# Patient Record
Sex: Female | Born: 1955 | Race: White | Hispanic: No | Marital: Married | Smoking: Former smoker
Health system: Southern US, Community
[De-identification: ages and names within clinical notes are randomized; demographics above are authoritative.]

## PROBLEM LIST (undated history)

## (undated) DIAGNOSIS — IMO0002 Reserved for concepts with insufficient information to code with codable children: Secondary | ICD-10-CM

## (undated) DIAGNOSIS — H269 Unspecified cataract: Secondary | ICD-10-CM

## (undated) DIAGNOSIS — G4733 Obstructive sleep apnea (adult) (pediatric): Secondary | ICD-10-CM

## (undated) DIAGNOSIS — M1990 Unspecified osteoarthritis, unspecified site: Secondary | ICD-10-CM

## (undated) DIAGNOSIS — H409 Unspecified glaucoma: Secondary | ICD-10-CM

## (undated) DIAGNOSIS — T7840XA Allergy, unspecified, initial encounter: Secondary | ICD-10-CM

## (undated) HISTORY — DX: Unspecified glaucoma: H40.9

## (undated) HISTORY — DX: Reserved for concepts with insufficient information to code with codable children: IMO0002

## (undated) HISTORY — DX: Obstructive sleep apnea (adult) (pediatric): G47.33

## (undated) HISTORY — DX: Unspecified osteoarthritis, unspecified site: M19.90

## (undated) HISTORY — PX: EYE SURGERY: SHX253

## (undated) HISTORY — DX: Allergy, unspecified, initial encounter: T78.40XA

## (undated) HISTORY — DX: Unspecified cataract: H26.9

## (undated) HISTORY — PX: TUBAL LIGATION: SHX77

---
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
## 1993-06-24 HISTORY — PX: TOTAL ABDOMINAL HYSTERECTOMY: SHX209

## 2000-04-10 ENCOUNTER — Emergency Department (HOSPITAL_COMMUNITY): Admission: EM | Admit: 2000-04-10 | Discharge: 2000-04-10 | Payer: Self-pay | Admitting: Emergency Medicine

## 2002-04-30 ENCOUNTER — Encounter: Payer: Self-pay | Admitting: Pulmonary Disease

## 2002-04-30 ENCOUNTER — Ambulatory Visit (HOSPITAL_BASED_OUTPATIENT_CLINIC_OR_DEPARTMENT_OTHER): Admission: RE | Admit: 2002-04-30 | Discharge: 2002-04-30 | Payer: Self-pay | Admitting: *Deleted

## 2002-09-15 ENCOUNTER — Emergency Department (HOSPITAL_COMMUNITY): Admission: EM | Admit: 2002-09-15 | Discharge: 2002-09-15 | Payer: Self-pay | Admitting: Emergency Medicine

## 2002-09-15 ENCOUNTER — Encounter: Payer: Self-pay | Admitting: Emergency Medicine

## 2004-06-15 ENCOUNTER — Emergency Department (HOSPITAL_COMMUNITY): Admission: EM | Admit: 2004-06-15 | Discharge: 2004-06-15 | Payer: Self-pay | Admitting: Emergency Medicine

## 2004-09-20 ENCOUNTER — Ambulatory Visit: Payer: Self-pay | Admitting: Pulmonary Disease

## 2005-02-28 ENCOUNTER — Ambulatory Visit: Payer: Self-pay | Admitting: Physical Medicine & Rehabilitation

## 2005-02-28 ENCOUNTER — Encounter
Admission: RE | Admit: 2005-02-28 | Discharge: 2005-05-29 | Payer: Self-pay | Admitting: Physical Medicine & Rehabilitation
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
## 2005-10-02 ENCOUNTER — Ambulatory Visit: Payer: Self-pay | Admitting: Pulmonary Disease

## 2006-11-26 ENCOUNTER — Ambulatory Visit: Payer: Self-pay | Admitting: Pulmonary Disease

## 2008-09-01 DIAGNOSIS — G4733 Obstructive sleep apnea (adult) (pediatric): Secondary | ICD-10-CM

## 2008-09-02 ENCOUNTER — Ambulatory Visit: Payer: Self-pay | Admitting: Pulmonary Disease

## 2010-11-09 NOTE — Procedures (Signed)
Courtney Mann, AHN NO.:  [id number]   MEDICAL RECORD NO.:  [id number]          PATIENT TYPE:  REC   LOCATION:  [facility]                          FACILITY:  [facility]   PHYSICIAN:  Erick Colace, M.D.DATE OF BIRTH:  Oct 05, 1955   DATE OF PROCEDURE:  03/14/2005  DATE OF DISCHARGE:                                 OPERATIVE REPORT   MEDICAL RECORD NUMBER:  [id number].   A 55 year old female with neck pain as well as upper trapezius and right arm  pain. She states she barely tolerated the acupuncture needles placed on  March 04, 2005 but would like to try another treatment today.   Placed 1/2-inch needle at points 3-cm lateral spinous process of C3, C5, C7.  The patient decided that she could not tolerate the idea of needles in her  neck and decided to terminate the treatment. She had no adverse events but  simply stated she does not like needles. I will see her back on an as-needed  basis, and she will follow up with Dr. Danne Harbor.      Erick Colace, M.D.  Electronically Signed     AEK/MEDQ  D:  03/14/2005 13:03:22  T:  03/15/2005 10:04:41  Job:  [id number]   cc:   Caralyn Guile. Ethelene Hal, M.D.  Fax: [phone]
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
## 2011-05-06 ENCOUNTER — Ambulatory Visit: Payer: Self-pay | Admitting: Pulmonary Disease

## 2011-05-27 ENCOUNTER — Ambulatory Visit: Payer: Self-pay | Admitting: Pulmonary Disease

## 2011-06-10 ENCOUNTER — Encounter: Payer: Self-pay | Admitting: Pulmonary Disease

## 2011-06-11 ENCOUNTER — Encounter: Payer: Self-pay | Admitting: Pulmonary Disease

## 2011-06-11 ENCOUNTER — Ambulatory Visit (INDEPENDENT_AMBULATORY_CARE_PROVIDER_SITE_OTHER): Payer: 59 | Admitting: Pulmonary Disease

## 2011-06-11 VITALS — BP 138/88 | HR 71 | Temp 97.9°F | Ht 67.0 in | Wt 235.0 lb

## 2011-06-11 DIAGNOSIS — G4733 Obstructive sleep apnea (adult) (pediatric): Secondary | ICD-10-CM

## 2011-06-11 NOTE — Assessment & Plan Note (Signed)
The patient is doing well on CPAP currently, and feels that she is sleeping adequately with no significant sleepiness issues during the day.  She is overdue for a new mask, and denies any functioning issues with her CPAP device.  She is having some very mild air gulping that occurs intermittently, but it is not an issue for her.  I have told her that we can change her mask or pressure delivery if this becomes more of an issue.  I have also encouraged her to work aggressively on weight loss.

## 2011-06-11 NOTE — Patient Instructions (Addendum)
Continue with cpap, but call if your air gulping is getting worse. Work on weight loss followup with me in one year.

## 2011-06-11 NOTE — Progress Notes (Signed)
  Subjective:    Patient ID: Courtney Mann, female    DOB: 05-Apr-1956, 55 y.o.   MRN: [id number]  HPI The patient comes in today for followup of her known sleep apnea.  She is wearing CPAP compliantly, and is having no issues with her mask or device.  Her mask is due for a change.  Her only complaint today is mild air gulping that occurs on rare occasions.  She does not feel that it is a significant issue for her.  She feels that she sleeps well with the CPAP device, and is satisfied with her alertness during the day.   Review of Systems  Constitutional: Negative for fever and unexpected weight change.  HENT: Positive for congestion, dental problem and sinus pressure. Negative for ear pain, nosebleeds, sore throat, rhinorrhea, sneezing, trouble swallowing and postnasal drip.   Eyes: Positive for redness and itching.  Respiratory: Negative for cough, chest tightness, shortness of breath and wheezing.   Cardiovascular: Positive for palpitations and leg swelling.  Gastrointestinal: Negative for nausea and vomiting.  Genitourinary: Negative for dysuria.  Musculoskeletal: Positive for joint swelling.  Skin: Negative for rash.  Neurological: Positive for headaches.  Hematological: Does not bruise/bleed easily.  Psychiatric/Behavioral: Negative for dysphoric mood. The patient is not nervous/anxious.        Objective:   Physical Exam Overweight female in no acute distress No skin breakdown or pressure necrosis from the CPAP mask Nose without purulence or discharge Lower extremities with mild edema, no cyanosis Alert, does not appear to be sleepy, moves all 4 extremities.       Assessment & Plan:

## 2011-07-26 ENCOUNTER — Telehealth: Payer: Self-pay | Admitting: Pulmonary Disease

## 2011-07-26 DIAGNOSIS — G4733 Obstructive sleep apnea (adult) (pediatric): Secondary | ICD-10-CM

## 2011-07-26 NOTE — Telephone Encounter (Signed)
Per megan okay to send order for new cpap mask and supplies since pt was just seen 06/11/11 and told to f/u in 1 year. Order has been sent to [facility].

## 2012-06-10 ENCOUNTER — Ambulatory Visit: Payer: 59 | Admitting: Pulmonary Disease

## 2012-06-15 ENCOUNTER — Ambulatory Visit (INDEPENDENT_AMBULATORY_CARE_PROVIDER_SITE_OTHER): Payer: 59 | Admitting: Pulmonary Disease

## 2012-06-15 ENCOUNTER — Encounter: Payer: Self-pay | Admitting: Pulmonary Disease

## 2012-06-15 VITALS — BP 122/76 | HR 82 | Temp 97.6°F | Ht 67.0 in | Wt 221.2 lb

## 2012-06-15 DIAGNOSIS — G4733 Obstructive sleep apnea (adult) (pediatric): Secondary | ICD-10-CM

## 2012-06-15 NOTE — Patient Instructions (Addendum)
Continue with cpap, and keep up with mask changes. Keep working on weight loss, you are doing great. Call us if you lose further weight, and feel the pressure is too high.  We can re-optimize your pressure at home at that point.  followup with me in one year.

## 2012-06-15 NOTE — Assessment & Plan Note (Signed)
The patient has been wearing CPAP compliantly, and has done well with weight loss.  She had actually lost more weight prior to this visit, and felt the pressure was too high.  Currently, she feels the pressure is adequate, and I have asked her to call us if she continues to lose weight so that we can optimize her pressure again.  I've also asked her to keep up with mask changes and supplies.

## 2012-06-15 NOTE — Progress Notes (Signed)
  Subjective:    Patient ID: Courtney Mann, female    DOB: 05-Mar-1956, 56 y.o.   MRN: [id number]  HPI The patient comes in today for followup of her known obstructive sleep apnea.  She has lost over 30 pounds since last visit, and was feeling the pressure was too high.  She has since gained half of that back, and now feels that her pressure is adequate.  She is sleeping well with the device, and has adequate daytime alertness.   Review of Systems  Constitutional: Negative for fever and unexpected weight change.  HENT: Positive for congestion, rhinorrhea, postnasal drip and sinus pressure. Negative for ear pain, nosebleeds, sore throat, sneezing, trouble swallowing and dental problem.   Eyes: Negative for redness and itching.  Respiratory: Negative for cough, chest tightness, shortness of breath and wheezing.   Cardiovascular: Positive for palpitations ( dysrhythmia) and leg swelling ( lower legs and ankles).  Gastrointestinal: Negative for nausea and vomiting.  Genitourinary: Negative for dysuria.  Musculoskeletal: Positive for arthralgias ( arthritis in both hips and spine, bursitis in left hip). Negative for joint swelling.  Skin: Negative for rash.  Neurological: Positive for headaches.  Hematological: Does not bruise/bleed easily.  Psychiatric/Behavioral: Negative for dysphoric mood. The patient is not nervous/anxious.        Objective:   Physical Exam Obese female in no acute distress No skin breakdown or pressure necrosis from the CPAP mask Nose without purulence or discharge noted Neck without lymphadenopathy or thyromegaly Lower extremities with mild edema, no cyanosis Alert and oriented, moves all 4 extremities.        Assessment & Plan:

## 2013-04-18 ENCOUNTER — Ambulatory Visit: Payer: 59 | Admitting: Family Medicine

## 2013-04-18 VITALS — BP 110/70 | HR 92 | Temp 97.8°F | Resp 18 | Ht 67.5 in | Wt 216.0 lb

## 2013-04-18 DIAGNOSIS — R300 Dysuria: Secondary | ICD-10-CM

## 2013-04-18 DIAGNOSIS — N390 Urinary tract infection, site not specified: Secondary | ICD-10-CM

## 2013-04-18 DIAGNOSIS — N898 Other specified noninflammatory disorders of vagina: Secondary | ICD-10-CM

## 2013-04-18 LAB — POCT WET PREP WITH KOH
KOH Prep POC: NEGATIVE
RBC Wet Prep HPF POC: NEGATIVE
Trichomonas, UA: NEGATIVE
Yeast Wet Prep HPF POC: NEGATIVE

## 2013-04-18 LAB — POCT UA - MICROSCOPIC ONLY
Casts, Ur, LPF, POC: NEGATIVE
Crystals, Ur, HPF, POC: NEGATIVE
Mucus, UA: NEGATIVE
Yeast, UA: NEGATIVE

## 2013-04-18 LAB — POCT URINALYSIS DIPSTICK
Bilirubin, UA: NEGATIVE
Glucose, UA: NEGATIVE
Ketones, UA: NEGATIVE
Nitrite, UA: NEGATIVE
Protein, UA: NEGATIVE
Spec Grav, UA: 1.01
Urobilinogen, UA: 0.2
pH, UA: 6

## 2013-04-18 MED ORDER — NITROFURANTOIN MONOHYD MACRO 100 MG PO CAPS: 100.0000 mg | ORAL_CAPSULE | Freq: Two times a day (BID) | ORAL | Status: DC

## 2013-04-18 MED ORDER — CEPHALEXIN 500 MG PO CAPS: 500.0000 mg | ORAL_CAPSULE | Freq: Four times a day (QID) | ORAL | Status: DC

## 2013-04-18 NOTE — Patient Instructions (Signed)
Urinary Tract Infection  Urinary tract infections (UTIs) can develop anywhere along your urinary tract. Your urinary tract is your body's drainage system for removing wastes and extra water. Your urinary tract includes two kidneys, two ureters, a bladder, and a urethra. Your kidneys are a pair of bean-shaped organs. Each kidney is about the size of your fist. They are located below your ribs, one on each side of your spine.  CAUSES  Infections are caused by microbes, which are microscopic organisms, including fungi, viruses, and bacteria. These organisms are so small that they can only be seen through a microscope. Bacteria are the microbes that most commonly cause UTIs.  SYMPTOMS   Symptoms of UTIs may vary by age and gender of the patient and by the location of the infection. Symptoms in young women typically include a frequent and intense urge to urinate and a painful, burning feeling in the bladder or urethra during urination. Older women and men are more likely to be tired, shaky, and weak and have muscle aches and abdominal pain. A fever may mean the infection is in your kidneys. Other symptoms of a kidney infection include pain in your back or sides below the ribs, nausea, and vomiting.  DIAGNOSIS  To diagnose a UTI, your caregiver will ask you about your symptoms. Your caregiver also will ask to provide a urine sample. The urine sample will be tested for bacteria and white blood cells. White blood cells are made by your body to help fight infection.  TREATMENT   Typically, UTIs can be treated with medication. Because most UTIs are caused by a bacterial infection, they usually can be treated with the use of antibiotics. The choice of antibiotic and length of treatment depend on your symptoms and the type of bacteria causing your infection.  HOME CARE INSTRUCTIONS   If you were prescribed antibiotics, take them exactly as your caregiver instructs you. Finish the medication even if you feel better after you  have only taken some of the medication.   Drink enough water and fluids to keep your urine clear or pale yellow.   Avoid caffeine, tea, and carbonated beverages. They tend to irritate your bladder.   Empty your bladder often. Avoid holding urine for long periods of time.   Empty your bladder before and after sexual intercourse.   After a bowel movement, women should cleanse from front to back. Use each tissue only once.  SEEK MEDICAL CARE IF:    You have back pain.   You develop a fever.   Your symptoms do not begin to resolve within 3 days.  SEEK IMMEDIATE MEDICAL CARE IF:    You have severe back pain or lower abdominal pain.   You develop chills.   You have nausea or vomiting.   You have continued burning or discomfort with urination.  MAKE SURE YOU:    Understand these instructions.   Will watch your condition.   Will get help right away if you are not doing well or get worse.  Document Released: 03/20/2005 Document Revised: 12/10/2011 Document Reviewed: 07/19/2011  [employer] Patient Information 2014 [employer]

## 2013-04-18 NOTE — Progress Notes (Signed)
[facility]:  Office Visit  Chief Complaint:  Chief Complaint  Patient presents with   Dysuria   Vaginal Discharge    HPI: Courtney Mann is a 57 y.o. female who is here for  Vaginal discharge. The discharge started about two months ago and no foul smell. She does a vaginal douche every other day. Had a yeast infection two weeks ago which turned into a UTI. She had some burning pain with urination , denies fevers, chill, n/v/pelvic pain or acute back pain  Past Medical History  Diagnosis Date   OSA (obstructive sleep apnea)    Allergy    Arthritis    Ulcer    Cataract    Glaucoma    Past Surgical History  Procedure Laterality Date   Total abdominal hysterectomy  1995   Tonsillectomy and adenoidectomy  1960   Tubal ligation     Eye surgery     History   Social History   Marital Status: Married    Spouse Name: N/A    Number of Children: N/A   Years of Education: N/A   Social History Main Topics   Smoking status: Former Smoker -- 4.00 packs/day for 14 years    Types: Cigarettes    Quit date: 06/24/1980   Smokeless tobacco: None     Comment: 4 PPD x 4-5 years   Alcohol Use: None   Drug Use: None   Sexual Activity: None   Other Topics Concern   None   Social History Narrative   None   Family History  Problem Relation Age of Onset   Hypertension Mother    Lung cancer Father    Dementia Maternal Grandmother    Vision loss Maternal Grandfather    Vision loss Paternal Grandfather    Cirrhosis Brother    Allergies  Allergen Reactions   Azithromycin    Ciprofloxacin    Clarithromycin     Biaxin   Ibuprofen     ? hives   Sulfamethoxazole-Trimethoprim    Prior to Admission medications   Medication Sig Start Date End Date Taking? Authorizing Provider  acetaminophen (ACETAMIN) 500 MG tablet Take 1,000 mg by mouth as needed.     Yes Historical Provider, MD  aspirin 325 MG tablet Take 325 mg by mouth as needed.      Yes Historical Provider, MD  lidocaine (LIDODERM) 5 % Place 1 patch onto the skin daily. Remove & Discard patch within 12 hours or as directed by MD    Yes Historical Provider, MD     ROS: The patient denies fevers, chills, night sweats, unintentional weight loss, chest pain, palpitations, wheezing, dyspnea on exertion, nausea, vomiting, , , hematuria, melena, numbness, weakness, or tingling.   All other systems have been reviewed and were otherwise negative with the exception of those mentioned in the HPI and as above.    PHYSICAL EXAM: Filed Vitals:   04/18/13 0859  BP: 110/70  Pulse: 92  Temp: 97.8 F (36.6 C)  Resp: 18   Filed Vitals:   04/18/13 0859  Height: 5' 7.5" (1.715 m)  Weight: 216 lb (97.977 kg)   Body mass index is 33.31 kg/(m^2).  General: Alert, no acute distress HEENT:  Normocephalic, atraumatic, oropharynx patent. EOMI, PERRLA Cardiovascular:  Regular rate and rhythm, no rubs murmurs or gallops.  No Carotid bruits, radial pulse intact. No pedal edema.  Respiratory: Clear to auscultation bilaterally.  No wheezes, rales, or rhonchi.  No cyanosis, no use of  accessory musculature GI: No organomegaly, abdomen is soft and non-tender, positive bowel sounds.  No masses. Skin: No rashes. Neurologic: Facial musculature symmetric. Psychiatric: Patient is appropriate throughout our interaction. Lymphatic: No cervical lymphadenopathy Musculoskeletal: Gait intact. No CVA tenderness + prolapse bladder + clear vaginal dc, no odor. No masses/lesions No cervix    LABS: Results for orders placed in visit on 04/18/13  POCT UA - MICROSCOPIC ONLY      Result Value Range   WBC, Ur, HPF, POC tntc     RBC, urine, microscopic 2-6     Bacteria, U Microscopic 2+     Mucus, UA neg     Epithelial cells, urine per micros 0-1     Crystals, Ur, HPF, POC neg     Casts, Ur, LPF, POC neg     Yeast, UA neg    POCT URINALYSIS DIPSTICK      Result Value Range   Color, UA yellow      Clarity, UA cloudy     Glucose, UA neg     Bilirubin, UA neg     Ketones, UA neg     Spec Grav, UA 1.010     Blood, UA small     pH, UA 6.0     Protein, UA neg     Urobilinogen, UA 0.2     Nitrite, UA neg     Leukocytes, UA large (3+)    POCT WET PREP WITH KOH      Result Value Range   Trichomonas, UA Negative     Clue Cells Wet Prep HPF POC 1-2     Epithelial Wet Prep HPF POC 0-3     Yeast Wet Prep HPF POC neg     Bacteria Wet Prep HPF POC 1+     RBC Wet Prep HPF POC neg     WBC Wet Prep HPF POC 5-6     KOH Prep POC Negative       EKG/XRAY:   Primary read interpreted by Dr. Conley Rolls at [facility].   ASSESSMENT/PLAN: Encounter Diagnoses  Name Primary?   Dysuria Yes   Vaginal discharge    UTI (urinary tract infection)    High deductible Will give rx for keflex vs macrobid, whichever is cheaper for her since she is self pay with rx drugs F/u prn Declined urine cx since deductible is high, if she still has sxs after abx then she will return for OV and recheck  Gross sideeffects, risk and benefits, and alternatives of medications d/w patient. Patient is aware that all medications have potential sideeffects and we are unable to predict every sideeffect or drug-drug interaction that may occur.  LE, THAO PHUONG, DO 04/18/2013 11:11 AM

## 2013-05-31 ENCOUNTER — Encounter (INDEPENDENT_AMBULATORY_CARE_PROVIDER_SITE_OTHER): Payer: Self-pay

## 2013-05-31 ENCOUNTER — Encounter: Payer: Self-pay | Admitting: Pulmonary Disease

## 2013-05-31 ENCOUNTER — Ambulatory Visit (INDEPENDENT_AMBULATORY_CARE_PROVIDER_SITE_OTHER): Payer: 59 | Admitting: Pulmonary Disease

## 2013-05-31 VITALS — BP 122/82 | HR 63 | Temp 97.5°F | Ht 66.5 in | Wt 210.6 lb

## 2013-05-31 DIAGNOSIS — G4733 Obstructive sleep apnea (adult) (pediatric): Secondary | ICD-10-CM

## 2013-05-31 NOTE — Assessment & Plan Note (Signed)
The patient is doing well with CPAP, and has continued to lose weight slowly over time. I have commended her on this, and asked her to continue doing so. She feels that her pressure may be a little excessive at this time, and we'll therefore recheck her setting on the automatic mode for the next few weeks. I will then let her know the results, but she can remain on the automatic setting if she wishes to do so.

## 2013-05-31 NOTE — Progress Notes (Signed)
   Subjective:    Patient ID: Courtney Mann, female    DOB: 14-May-1956, 57 y.o.   MRN: [id number]  HPI The patient comes in today for followup of her obstructive sleep apnea. She is wearing CPAP compliantly, and overall has done well with the device. She has lost 11 pounds since last visit, and feels that her pressure may be a little bit elevated at this time. She is having no issues with her mask fit. She sleeps well, and has excellent daytime alertness.   Review of Systems  Constitutional: Negative for fever and unexpected weight change.  HENT: Positive for congestion and sinus pressure. Negative for dental problem, ear pain, nosebleeds, postnasal drip, rhinorrhea, sneezing, sore throat and trouble swallowing.        Deviated septum--chronic (minor)  Eyes: Negative for redness and itching.  Respiratory: Negative for cough, chest tightness, shortness of breath and wheezing.   Cardiovascular: Positive for palpitations and leg swelling.  Gastrointestinal: Negative for nausea and vomiting.  Genitourinary: Negative for dysuria.  Musculoskeletal: Negative for joint swelling.  Skin: Negative for rash.  Neurological: Positive for headaches.  Hematological: Does not bruise/bleed easily.  Psychiatric/Behavioral: Negative for dysphoric mood. The patient is not nervous/anxious.        Objective:   Physical Exam Overweight female in no acute distress Nose without purulence or discharge noted No skin breakdown or pressure necrosis from the CPAP mask Neck without lymphadenopathy or thyromegaly Lower extremities with minimal edema, no cyanosis Alert and oriented, moves all 4 extremities.       Assessment & Plan:

## 2013-05-31 NOTE — Patient Instructions (Signed)
Will put your machine on the auto setting for the next few weeks to re-optimize your pressure.  Will let you know the results, but you have the option of staying on the auto setting. Keep working on weight reduction.  You are doing well. followup with me in one year.

## 2013-07-25 ENCOUNTER — Other Ambulatory Visit: Payer: Self-pay | Admitting: Pulmonary Disease

## 2013-07-25 ENCOUNTER — Telehealth: Payer: Self-pay | Admitting: Pulmonary Disease

## 2013-07-25 DIAGNOSIS — G4733 Obstructive sleep apnea (adult) (pediatric): Secondary | ICD-10-CM

## 2013-07-26 ENCOUNTER — Telehealth: Payer: Self-pay | Admitting: Pulmonary Disease

## 2013-07-26 NOTE — Telephone Encounter (Signed)
[facility] Let pt and dme know that her optimal pressure on download appears to be 11-12cm. She can stay on auto if she wishes, or put her machine back on 11cm. Dawne LMTCB for the pt about this.   I spoke with the pt and advised. She wants to go with set pressure of 11. I will let Dawne know so [facility]. Carron CurieJennifer Caren Garske, CMA

## 2013-10-12 ENCOUNTER — Telehealth: Payer: Self-pay | Admitting: Pulmonary Disease

## 2013-10-12 DIAGNOSIS — G4733 Obstructive sleep apnea (adult) (pediatric): Secondary | ICD-10-CM

## 2013-10-12 NOTE — Telephone Encounter (Signed)
I spoke with the pt and she states that her humidifier tank is broken and she called [facility] to get a replacement tank and they advised that she has to have an order. Order placed. Carron CurieJennifer Castillo, CMA

## 2014-05-31 ENCOUNTER — Ambulatory Visit: Payer: 59 | Admitting: Pulmonary Disease

## 2014-08-05 ENCOUNTER — Ambulatory Visit: Payer: Self-pay | Admitting: Pulmonary Disease

## 2014-08-15 ENCOUNTER — Ambulatory Visit: Payer: Self-pay | Admitting: Pulmonary Disease

## 2014-09-15 ENCOUNTER — Ambulatory Visit: Payer: Self-pay | Admitting: Pulmonary Disease

## 2014-10-04 ENCOUNTER — Ambulatory Visit (INDEPENDENT_AMBULATORY_CARE_PROVIDER_SITE_OTHER): Payer: Managed Care, Other (non HMO) | Admitting: Pulmonary Disease

## 2014-10-04 ENCOUNTER — Encounter: Payer: Self-pay | Admitting: Pulmonary Disease

## 2014-10-04 ENCOUNTER — Encounter (INDEPENDENT_AMBULATORY_CARE_PROVIDER_SITE_OTHER): Payer: Self-pay

## 2014-10-04 VITALS — BP 114/62 | HR 68 | Temp 97.0°F | Ht 66.5 in | Wt 216.8 lb

## 2014-10-04 DIAGNOSIS — G4733 Obstructive sleep apnea (adult) (pediatric): Secondary | ICD-10-CM

## 2014-10-04 NOTE — Progress Notes (Signed)
   Subjective:    Patient ID: Courtney Mann, female    DOB: [birth year], 59 y.o.   MRN: [id number]  HPI The patient comes in today for follow-up of her known obstructive sleep apnea. He is wearing C Pap compliantly by her history, and feels that she continues to benefit from the device. She is having some noise coming from her machine, but unfortunately cannot replace it financially at this time. Does not feel that it interferes with the functioning currently.   Review of Systems  Constitutional: Negative for fever and unexpected weight change.  HENT: Negative for congestion, dental problem, ear pain, nosebleeds, postnasal drip, rhinorrhea, sinus pressure, sneezing, sore throat and trouble swallowing.   Eyes: Negative for redness and itching.  Respiratory: Negative for cough, chest tightness, shortness of breath and wheezing.   Cardiovascular: Negative for palpitations and leg swelling.  Gastrointestinal: Negative for nausea and vomiting.  Genitourinary: Negative for dysuria.  Musculoskeletal: Negative for joint swelling.  Skin: Negative for rash.  Neurological: Negative for headaches.  Hematological: Does not bruise/bleed easily.  Psychiatric/Behavioral: Negative for dysphoric mood. The patient is not nervous/anxious.        Objective:   Physical Exam Overweight female in no acute distress Nose without purulence or discharge noted Neck without lymphadenopathy or thyromegaly No skin breakdown or pressure necrosis from the C Pap mask Lower extremities without edema, no cyanosis Alert and oriented, moves all 4 extremities.       Assessment & Plan:

## 2014-10-04 NOTE — Assessment & Plan Note (Signed)
The patient continues to wear her C and feels that she benefits from the device. However, her machine is starting to make noise, she cannot afford to replace until she starts her new job with benefits. I've asked her to try and keep up with mask cushions and supplies, and also work aggressively on weight loss. She is to call us if her machine stops working prior to getting her new insurance.

## 2014-10-04 NOTE — Patient Instructions (Signed)
Continue on current cpap, but let us know if quits working. Work on weight loss followup again in one year, but call when your new insurance kick in and we can arrange for a new device with auto setting.

## 2014-10-20 ENCOUNTER — Ambulatory Visit (INDEPENDENT_AMBULATORY_CARE_PROVIDER_SITE_OTHER): Payer: Managed Care, Other (non HMO)

## 2014-10-20 ENCOUNTER — Encounter: Payer: Self-pay | Admitting: Podiatry

## 2014-10-20 ENCOUNTER — Ambulatory Visit (INDEPENDENT_AMBULATORY_CARE_PROVIDER_SITE_OTHER): Payer: Managed Care, Other (non HMO) | Admitting: Podiatry

## 2014-10-20 VITALS — BP 130/74 | HR 72 | Resp 16

## 2014-10-20 DIAGNOSIS — S92911A Unspecified fracture of right toe(s), initial encounter for closed fracture: Secondary | ICD-10-CM | POA: Diagnosis not present

## 2014-10-20 DIAGNOSIS — S99921A Unspecified injury of right foot, initial encounter: Secondary | ICD-10-CM

## 2014-10-20 DIAGNOSIS — L03012 Cellulitis of left finger: Secondary | ICD-10-CM

## 2014-10-20 NOTE — Progress Notes (Signed)
   Subjective:    Patient ID: Courtney Mann, female    DOB: 12/26/1955, 59 y.o.   MRN: [id number]  HPI Comments: "I got my toe caught on Monday"  Patient c/o tender 5th toe right since Monday (3 days). She got her toe caught on an "end cap" at the grocery store and pulled to toenail back. The toe initially bled and the toe has started to swell and get red. She thinks that the toe itself was pulled backward too. Very sore and has kept covered.      Review of Systems  HENT: Positive for tinnitus.   Gastrointestinal: Positive for diarrhea.  Musculoskeletal: Positive for arthralgias and gait problem.  All other systems reviewed and are negative.      Objective:   Physical Exam: I reviewed her past history medications surgery social history and review of systems. Pulses are strongly palpable right foot. Neurologic sensorium is intact reasons with the monofilament. Degenerative flexor intact bilateral muscle strength is 5 over 5 dorsiflexion plantarflexion and inversion everters on his musculature is intact. Orthopedic evaluation demonstrates pain on palpation fifth digit of the right foot radiographs confirmed fracture of the fifth digit. Cutaneous evaluation demonstrates near complete nail avulsion of the fifth digit right. With mild paronychia.         Assessment & Plan:  Assessment: Fracture fifth digit right foot proximal phalanx. Paronychia fifth digit right foot.  Plan: Total nail avulsion with I and D today we cleaned up all of the necrotic tissue and granulation tissue. This was performed after local anesthesia was administered to the fifth digit right foot. She tolerated procedure well and will follow up with me in 1 week. She should have questions or concerns she will notify us immediately. She was given both oral and written home-going instructions for care and soaking of her toe.

## 2014-10-20 NOTE — Patient Instructions (Signed)

## 2014-10-27 ENCOUNTER — Ambulatory Visit (INDEPENDENT_AMBULATORY_CARE_PROVIDER_SITE_OTHER): Payer: Managed Care, Other (non HMO) | Admitting: Podiatry

## 2014-10-27 ENCOUNTER — Encounter: Payer: Self-pay | Admitting: Podiatry

## 2014-10-27 DIAGNOSIS — L03012 Cellulitis of left finger: Secondary | ICD-10-CM

## 2014-10-27 DIAGNOSIS — S92911A Unspecified fracture of right toe(s), initial encounter for closed fracture: Secondary | ICD-10-CM

## 2014-10-27 NOTE — Progress Notes (Signed)
She presents today for follow-up of her fifth toe right foot. She fractured her fifth digit right foot which she's been taping. We also performed a nail avulsion to the fifth nail plate right. She denies fever chills nausea vomiting muscle aches and pains. States that she continues to soak twice daily and Betadine and warm water.  Objective: Vital signs are stable alert and oriented 3. No signs of infection. Toe is still moderately swollen but the nail bed appears to be well healed.  Assessment: Well-healing surgical nail avulsion. Fracture fifth proximal phalanx.  Plan: Continue to tape the toe but may discontinue the use of soaking. I will follow up with her as needed.

## 2014-10-27 NOTE — Patient Instructions (Signed)

## 2014-12-06 NOTE — Telephone Encounter (Signed)
error 

## 2014-12-19 ENCOUNTER — Encounter: Payer: Self-pay | Admitting: Podiatry

## 2015-04-28 ENCOUNTER — Ambulatory Visit (INDEPENDENT_AMBULATORY_CARE_PROVIDER_SITE_OTHER): Payer: Self-pay | Admitting: Emergency Medicine

## 2015-04-28 VITALS — BP 110/62 | HR 84 | Temp 98.0°F | Resp 16 | Ht 66.5 in | Wt 213.0 lb

## 2015-04-28 DIAGNOSIS — N3000 Acute cystitis without hematuria: Secondary | ICD-10-CM

## 2015-04-28 LAB — POCT URINALYSIS DIP (MANUAL ENTRY)
Bilirubin, UA: NEGATIVE
GLUCOSE UA: NEGATIVE
Ketones, POC UA: NEGATIVE
Nitrite, UA: POSITIVE — AB
PH UA: 5.5
Protein Ur, POC: NEGATIVE
UROBILINOGEN UA: 0.2

## 2015-04-28 LAB — POC MICROSCOPIC URINALYSIS (UMFC)

## 2015-04-28 MED ORDER — NITROFURANTOIN MONOHYD MACRO 100 MG PO CAPS: 100.0000 mg | ORAL_CAPSULE | Freq: Two times a day (BID) | ORAL | Status: DC

## 2015-04-28 MED ORDER — PHENAZOPYRIDINE HCL 200 MG PO TABS: 200.0000 mg | ORAL_TABLET | Freq: Three times a day (TID) | ORAL | Status: DC | PRN

## 2015-04-28 NOTE — Patient Instructions (Signed)

## 2015-04-28 NOTE — Progress Notes (Signed)
Subjective:  Patient ID: Courtney Mann, female    DOB: [birth year]  Age: 59 y.o. MRN: [id number]  CC: Dysuria   HPI Courtney Mann presents  patient has a history of prior urinary tract infection she has dysuria urgency and frequency. Has no back pain abdominal pain nausea vomiting vaginal discharge or bleeding. She is a Engineer, civil (consulting). She's had no improvement with home remedies. Does not take any medication for the cystitis  History Jansen has a past medical history of OSA (obstructive sleep apnea); Allergy; Arthritis; Ulcer; Cataract; and Glaucoma.   She has past surgical history that includes Total abdominal hysterectomy (1995); Tonsillectomy and adenoidectomy (1960); Tubal ligation; and Eye surgery.   Her  family history includes Cirrhosis in her brother; Dementia in her maternal grandmother; Hypertension in her mother; Lung cancer in her father; Vision loss in her maternal grandfather and paternal grandfather.  She   reports that she quit smoking about 34 years ago. Her smoking use included Cigarettes. She has a 56 pack-year smoking history. She does not have any smokeless tobacco history on file. Her alcohol and drug histories are not on file.  Outpatient Prescriptions Prior to Visit  Medication Sig Dispense Refill  . aspirin 325 MG tablet Take 650 mg by mouth as needed.     . lidocaine (LIDODERM) 5 % Place 1 patch onto the skin daily. Remove & Discard patch within 12 hours or as directed by MD     . acetaminophen (ACETAMIN) 500 MG tablet Take 1,000 mg by mouth as needed.       No facility-administered medications prior to visit.    Social History   Social History  . Marital Status: Married    Spouse Name: N/A  . Number of Children: N/A  . Years of Education: N/A   Social History Main Topics  . Smoking status: Former Smoker -- 4.00 packs/day for 14 years    Types: Cigarettes    Quit date: 06/24/1980  . Smokeless tobacco: None     Comment: 4 PPD x 4-5 years  . Alcohol Use: None    . Drug Use: None  . Sexual Activity: Not Asked   Other Topics Concern  . None   Social History Narrative     Review of Systems  Constitutional: Negative for fever, chills and appetite change.  HENT: Negative for congestion, ear pain, postnasal drip, sinus pressure and sore throat.   Eyes: Negative for pain and redness.  Respiratory: Negative for cough, shortness of breath and wheezing.   Cardiovascular: Negative for leg swelling.  Gastrointestinal: Negative for nausea, vomiting, abdominal pain, diarrhea, constipation and blood in stool.  Endocrine: Negative for polyuria.  Genitourinary: Positive for dysuria, urgency and frequency. Negative for flank pain.  Musculoskeletal: Negative for gait problem.  Skin: Negative for rash.  Neurological: Negative for weakness and headaches.  Psychiatric/Behavioral: Negative for confusion and decreased concentration. The patient is not nervous/anxious.     Objective:  BP 110/62 mmHg  Pulse 84  Temp(Src) 98 F (36.7 C) (Oral)  Resp 16  Ht 5' 6.5" (1.689 m)  Wt 213 lb (96.616 kg)  BMI 33.87 kg/m2  SpO2 96%  Physical Exam  Constitutional: She is oriented to person, place, and time. She appears well-developed and well-nourished. No distress.  HENT:  Head: Normocephalic and atraumatic.  Right Ear: External ear normal.  Left Ear: External ear normal.  Nose: Nose normal.  Eyes: Conjunctivae and EOM are normal. Pupils are equal, round, and reactive to light. No scleral  icterus.  Neck: Normal range of motion. Neck supple. No tracheal deviation present.  Cardiovascular: Normal rate, regular rhythm and normal heart sounds.   Pulmonary/Chest: Effort normal. No respiratory distress. She has no wheezes. She has no rales.  Abdominal: She exhibits no mass. There is no tenderness. There is no rebound and no guarding.  Musculoskeletal: She exhibits no edema.  Lymphadenopathy:    She has no cervical adenopathy.  Neurological: She is alert and  oriented to person, place, and time. Coordination normal.  Skin: Skin is warm and dry. No rash noted.  Psychiatric: She has a normal mood and affect. Her behavior is normal.      Assessment & Plan:   Lawson FiscalLori was seen today for dysuria.  Diagnoses and all orders for this visit:  Acute cystitis without hematuria -     POCT urinalysis dipstick -     POCT Microscopic Urinalysis ([facility])  Other orders -     nitrofurantoin, macrocrystal-monohydrate, (MACROBID) 100 MG capsule; Take 1 capsule (100 mg total) by mouth 2 (two) times daily. -     phenazopyridine (PYRIDIUM) 200 MG tablet; Take 1 tablet (200 mg total) by mouth 3 (three) times daily as needed.   I have discontinued Ms. Joseph BerkshireGalena's acetaminophen. I am also having her start on nitrofurantoin (macrocrystal-monohydrate) and phenazopyridine. Additionally, I am having her maintain her lidocaine and aspirin.  Meds ordered this encounter  Medications  . nitrofurantoin, macrocrystal-monohydrate, (MACROBID) 100 MG capsule    Sig: Take 1 capsule (100 mg total) by mouth 2 (two) times daily.    Dispense:  20 capsule    Refill:  0  . phenazopyridine (PYRIDIUM) 200 MG tablet    Sig: Take 1 tablet (200 mg total) by mouth 3 (three) times daily as needed.    Dispense:  6 tablet    Refill:  0    Appropriate red flag conditions were discussed with the patient as well as actions that should be taken.  Patient expressed his understanding.  Follow-up: Return if symptoms worsen or fail to improve.  Carmelina DaneAnderson, Miller Limehouse S, MD  Results for orders placed or performed in visit on 04/28/15  POCT urinalysis dipstick  Result Value Ref Range   Color, UA orange (A) yellow   Clarity, UA cloudy (A) clear   Glucose, UA negative negative   Bilirubin, UA negative negative   Ketones, POC UA negative negative   Spec Grav, UA <=1.005    Blood, UA small (A) negative   pH, UA 5.5    Protein Ur, POC negative negative   Urobilinogen, UA 0.2    Nitrite, UA Positive  (A) Negative   Leukocytes, UA large (3+) (A) Negative  POCT Microscopic Urinalysis ([facility])  Result Value Ref Range   WBC,UR,HPF,POC Many (A) None WBC/hpf   RBC,UR,HPF,POC Moderate (A) None RBC/hpf   Bacteria Many (A) None, Too numerous to count   Mucus Present (A) Absent   Epithelial Cells, UR Per Microscopy Few (A) None, Too numerous to count cells/hpf

## 2015-05-05 ENCOUNTER — Telehealth: Payer: Self-pay

## 2015-05-05 ENCOUNTER — Telehealth: Payer: Self-pay | Admitting: Internal Medicine

## 2015-05-05 DIAGNOSIS — N3090 Cystitis, unspecified without hematuria: Secondary | ICD-10-CM

## 2015-05-05 MED ORDER — CEPHALEXIN 500 MG PO CAPS: 500.0000 mg | ORAL_CAPSULE | Freq: Three times a day (TID) | ORAL | Status: DC

## 2015-05-05 NOTE — Telephone Encounter (Signed)
Spoke to pt. She is starting to get some areas of minor rash. The itching is the worst part. No SOB. Swelling of throat. Informed pt she needs to go to ED or RTC if any swelling or SOB occurs. Pt did not want to take any Benadryl yet because she has things she has to get done today. She said she will take some later today. Pt is self pay and not able to RTC. Pt is still having some minor bladder pressure and minor frequency. Has been drinking plenty of fluids. Please advise.

## 2015-05-05 NOTE — Telephone Encounter (Signed)
LMOM to cb

## 2015-05-05 NOTE — Telephone Encounter (Signed)
Pt was seen on 11/4 by Dr. Dareen PianoAnderson for Acute cystitis without hematuria - Primary. She was given nitrofurantoin, macrocrystal-monohydrate, (MACROBID) 100 MG capsule [id number]]. She feels she is having an allergic reaction to this med. She is experiencing itching all over body, there is no raised rash areas yet. She would like to be put on a different med. Please advise at [phone]

## 2015-05-05 NOTE — Telephone Encounter (Signed)
Patient should take Zyrtec and Zantac (BID) for rash, itching. She is to stop Macrobid. I'll send a script for 3 days worth of cephalexin 3x/day.

## 2015-05-05 NOTE — Telephone Encounter (Signed)
Spoke to pt and informed her she can take Zyrtec and Zantac. Informed her we went in cephalexin to the pharmacy.

## 2015-05-22 ENCOUNTER — Encounter: Payer: Self-pay | Admitting: Internal Medicine

## 2016-01-26 ENCOUNTER — Ambulatory Visit (INDEPENDENT_AMBULATORY_CARE_PROVIDER_SITE_OTHER): Payer: Self-pay | Admitting: Pulmonary Disease

## 2016-01-26 ENCOUNTER — Encounter: Payer: Self-pay | Admitting: Pulmonary Disease

## 2016-01-26 DIAGNOSIS — G4733 Obstructive sleep apnea (adult) (pediatric): Secondary | ICD-10-CM

## 2016-01-26 NOTE — Assessment & Plan Note (Signed)
Coordinate with our office to bring in chip so that we can review download  We discussed options for CPAP supplies and getting a new CPAP in the future-with her no insurance situation

## 2016-01-26 NOTE — Patient Instructions (Addendum)
Coordinate with our office to bring in chip so that we can review download

## 2016-01-26 NOTE — Progress Notes (Signed)
   Subjective:    Patient ID: Courtney Mann, female    DOB: 1955-09-05, 60 y.o.   MRN: [id number]  HPI  Chief Complaint  Patient presents with  . Follow-up    Former [facility] patient, doing well on CPAP, pt is self pay so she has not been able to get machine adjusted.  Dr. Shelle Iron discussed altering her pressure to Auto setting, but she would have to pay for it.  She said when she has the money, she wants to change it. no download available.   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 Her machine is at least 23-37 years old but she has no money to replace it. She has been stretching her CPAP supplies for a long time for the same reason.  Bedtime is between 10-11 PM, sleep latency about 30 minutes, 3-5 nocturnal awakenings, out of bed at 5 AM feeling rested without dryness of mouth No problems with nasal pillows or pressure  Weight is unchanged, she may have gained some after her sleep study Epworth sleepiness score is 6   Significant tests/ events  NPSG 2003:  AHI 18/hr, cpap titrated to 11cm.  Auto 06/2013:  Optimal pressure 11-12cm.    Review of Systems Patient denies significant dyspnea,cough, hemoptysis,  chest pain, palpitations, pedal edema, orthopnea, paroxysmal nocturnal dyspnea, lightheadedness, nausea, vomiting, abdominal or  leg pains      Objective:   Physical Exam  Gen. Pleasant, obese, in no distress ENT - no lesions, no post nasal drip Neck: No JVD, no thyromegaly, no carotid bruits Lungs: no use of accessory muscles, no dullness to percussion, decreased without rales or rhonchi  Cardiovascular: Rhythm regular, heart sounds  normal, no murmurs or gallops, no peripheral edema Musculoskeletal: No deformities, no cyanosis or clubbing , no tremors       Assessment & Plan:

## 2016-02-22 ENCOUNTER — Telehealth: Payer: Self-pay | Admitting: Pulmonary Disease

## 2016-02-22 NOTE — Telephone Encounter (Signed)
cpap download placed in RA look out. Nothing further needed.

## 2016-05-13 ENCOUNTER — Telehealth: Payer: Self-pay | Admitting: Pulmonary Disease

## 2016-05-13 DIAGNOSIS — G4733 Obstructive sleep apnea (adult) (pediatric): Secondary | ICD-10-CM

## 2016-05-13 NOTE — Telephone Encounter (Signed)
Pt is [facility]. Nothing more needed at this time.

## 2017-01-27 ENCOUNTER — Ambulatory Visit: Payer: Self-pay | Admitting: Adult Health

## 2017-02-21 ENCOUNTER — Ambulatory Visit (INDEPENDENT_AMBULATORY_CARE_PROVIDER_SITE_OTHER): Payer: Self-pay | Admitting: Adult Health

## 2017-02-21 ENCOUNTER — Encounter: Payer: Self-pay | Admitting: Adult Health

## 2017-02-21 DIAGNOSIS — G4733 Obstructive sleep apnea (adult) (pediatric): Secondary | ICD-10-CM

## 2017-02-21 NOTE — Patient Instructions (Signed)
Continue on CPAP At bedtime   Wear each night for 4-6 hr each night .  follow up Dr. Vassie LollAlva  In 1 year and . As needed

## 2017-02-21 NOTE — Assessment & Plan Note (Addendum)
Controlled on CPAP   Plan  Patient Instructions  Continue on CPAP At bedtime   Wear each night for 4-6 hr each night .  follow up Dr. Vassie LollAlva  In 1 year and . As needed

## 2017-02-21 NOTE — Progress Notes (Signed)
@Patient  ID: Courtney EagleLori Labo, female    DOB: 03-Aug-1955, 61 y.o.   MRN: [id number]  Chief Complaint  Patient presents with  . Follow-up    OSA     Referring provider: No ref. provider found  HPI: 61 year old female followed for moderate sleep apnea  TEST  NPSG 2003:  AHI 18/hr, cpap titrated to 11cm.  Auto 06/2013:  Optimal pressure 11-12cm.   02/21/2017 Follow up ; OSA  Patient returns for a one-year follow-up. She is on C Pap at bedtime. Patient says she's wearing each night. Patient continues to have trouble as she has no insurance and cannot afford to replace her supplies on a regular basis. t she feels rested with no significant daytime sleepiness   Allergies  Allergen Reactions  . Azithromycin   . Ciprofloxacin   . Clarithromycin     Biaxin  . Ibuprofen     ? hives  . Sulfamethoxazole-Trimethoprim     There is no immunization history for the selected administration types on file for this patient.  Past Medical History:  Diagnosis Date  . Allergy   . Arthritis   . Cataract   . Glaucoma   . OSA (obstructive sleep apnea)   . Ulcer     Tobacco History: History  Smoking Status  . Former Smoker  . Packs/day: 4.00  . Years: 14.00  . Types: Cigarettes  . Quit date: 06/24/1978  Smokeless Tobacco  . Never Used    Comment: 4 PPD x 4-5 years   Counseling given: Not Answered   Outpatient Encounter Prescriptions as of 02/21/2017  Medication Sig  . aspirin 325 MG tablet Take 650 mg by mouth as needed.   . lidocaine (LIDODERM) 5 % Place 1 patch onto the skin daily. Remove & Discard patch within 12 hours or as directed by MD    No facility-administered encounter medications on file as of 02/21/2017.      Review of Systems  Constitutional:   No  weight loss, night sweats,  Fevers, chills, fatigue, or  lassitude.  HEENT:   No headaches,  Difficulty swallowing,  Tooth/dental problems, or  Sore throat,                No sneezing, itching, ear ache, nasal congestion,  post nasal drip,   CV:  No chest pain,  Orthopnea, PND, swelling in lower extremities, anasarca, dizziness, palpitations, syncope.   GI  No heartburn, indigestion, abdominal pain, nausea, vomiting, diarrhea, change in bowel habits, loss of appetite, bloody stools.   Resp: No shortness of breath with exertion or at rest.  No excess mucus, no productive cough,  No non-productive cough,  No coughing up of blood.  No change in color of mucus.  No wheezing.  No chest wall deformity  Skin: no rash or lesions.  GU: no dysuria, change in color of urine, no urgency or frequency.  No flank pain, no hematuria   MS:  No joint pain or swelling.  No decreased range of motion.  No back pain.    Physical Exam  BP 134/68 (BP Location: Left Arm, Cuff Size: Large)   Pulse 73   Ht 5' 6.75" (1.695 m)   Wt 231 lb 9.6 oz (105.1 kg)   SpO2 96%   BMI 36.55 kg/m   GEN: A/Ox3; pleasant , NAD,  Obese    HEENT:  Ider/AT,  EACs-clear, TMs-wnl, NOSE-clear, THROAT-clear, no lesions, no postnasal drip or exudate noted.   NECK:  Supple w/ fair  ROM; no JVD; normal carotid impulses w/o bruits; no thyromegaly or nodules palpated; no lymphadenopathy.    RESP  Clear  P & A; w/o, wheezes/ rales/ or rhonchi. no accessory muscle use, no dullness to percussion  CARD:  RRR, no m/r/g, no peripheral edema, pulses intact, no cyanosis or clubbing.  GI:   Soft & nt; nml bowel sounds; no organomegaly or masses detected.   Musco: Warm bil, no deformities or joint swelling noted.   Neuro: alert, no focal deficits noted.    Skin: Warm, no lesions or rashes    Lab Results:  CBC No results found for: WBC, RBC, HGB, HCT, PLT, MCV, MCH, MCHC, RDW, LYMPHSABS, MONOABS, EOSABS, BASOSABS  BMET No results found for: NA, K, CL, CO2, GLUCOSE, BUN, CREATININE, CALCIUM, GFRNONAA, GFRAA  BNP No results found for: BNP  ProBNP No results found for: PROBNP  Imaging: No results found.   Assessment & Plan:   Obstructive  sleep apnea Plan  Patient Instructions  Continue on CPAP At bedtime   Wear each night for 4-6 hr each night .  follow up Dr. Vassie Loll  In 1 year and . As needed         Rubye Oaks, NP 02/21/2017

## 2018-02-09 ENCOUNTER — Telehealth: Payer: Self-pay | Admitting: Pulmonary Disease

## 2018-02-09 DIAGNOSIS — G4733 Obstructive sleep apnea (adult) (pediatric): Secondary | ICD-10-CM

## 2018-02-09 NOTE — Telephone Encounter (Signed)
Spoke with pt. She is requesting a new CPAP machine and supplies. Advised her that it has been almost a year since we have seen her. [facility] [place] right now.  TP - are you okay with ordering this as you are the last one who saw her? Thanks.

## 2018-02-11 NOTE — Telephone Encounter (Signed)
Patient called to follow up on this message. Pt contact # [phone]

## 2018-02-11 NOTE — Telephone Encounter (Signed)
Called patient, advised her that TP will be back in the office today and we will call her once we have a response from TP. Patient verbalized understanding.   TP please advise thank you.

## 2018-02-12 ENCOUNTER — Telehealth: Payer: Self-pay | Admitting: Pulmonary Disease

## 2018-02-12 DIAGNOSIS — G4733 Obstructive sleep apnea (adult) (pediatric): Secondary | ICD-10-CM

## 2018-02-12 NOTE — Telephone Encounter (Signed)
Spoke with pt. She is aware that we will be refilling her CPAP supplies. Order has been placed. Nothing further was needed.

## 2018-02-12 NOTE — Telephone Encounter (Signed)
Rec'd called from Deneidra from [facility] that is a 3rd party that works with the patient's insurance to help patients get services when they are out of their local area. She states that neither of the supply companies that we were originally provided - provide cpap supplies or the cpap machine. She would us to send the order that states replacement cpap along with cpap supplies, as well as the sleep study and any supporting documentation to them and they will locate a company for the [place] in [place] that will be able to provide her what she needs, Phone number is [phone] option 4, then option 2. Fax number is [phone]. -pr

## 2018-02-12 NOTE — Telephone Encounter (Signed)
Patient states husband insurance [employer]selling [employer]) gave her these two companies in regards to ordering supplies because they are near [place].    Patient advised [facility] does not provide CPAP supplies; Patient states she will contact [employer] again to help determine correct place to order CPAP supplies;   [facility] [place] [facility] [place] [facility] now to sometime in October; possibly, the end of September.

## 2018-02-12 NOTE — Telephone Encounter (Signed)
Per TP: okay to send order for CPAP supplies, she just needs to make an appt for when she comes back to [place].  Thank you.

## 2018-02-12 NOTE — Telephone Encounter (Signed)
Pt is requesting an update on Cpap supplies. Cb is [phone]

## 2018-02-12 NOTE — Telephone Encounter (Signed)
Val EagleGalena, Dashonda  to Margorie Johnattray, Patrice E        12:26 PM  Pt needs cpap supplies and new cpap - [facility] and would like order sent to local co - either [facility] [phone] or [facility] [phone] -pr   Above is from previous message.    Left message for patient-need to let her know that [facility] can not supply her CPAP supplies. [facility] if she would like-need to place new order.

## 2018-02-12 NOTE — Telephone Encounter (Signed)
Called [facility] and confirmed their position regarding pts CPAP machine and supplies. Because [facility] is unable to provide supplies we will send order to [facility] to find an alternative supplier for pt's supplies. Order placed. Will sign off.

## 2018-02-19 ENCOUNTER — Telehealth: Payer: Self-pay | Admitting: Pulmonary Disease

## 2018-02-19 DIAGNOSIS — G4733 Obstructive sleep apnea (adult) (pediatric): Secondary | ICD-10-CM

## 2018-02-19 NOTE — Telephone Encounter (Signed)
This order has been updated. Nothing further was needed.

## 2018-02-24 ENCOUNTER — Telehealth: Payer: Self-pay | Admitting: Pulmonary Disease

## 2018-02-24 NOTE — Telephone Encounter (Signed)
Spoke with Agustin Cree at [facility], requesting office notes and copy of sleep study to be faxed to fulfill cpap order and supplies. Info faxed. Informed Darlene to call office if fax failed or if they needed more information. Nothing further needed at this time.

## 2018-03-13 ENCOUNTER — Telehealth: Payer: Self-pay | Admitting: Pulmonary Disease

## 2018-03-13 NOTE — Telephone Encounter (Signed)
Got cut off in midle of conversation guess they will call back, never got to what was needed calling from [facility].Courtney GriffinsStanley A Dalton

## 2018-04-02 ENCOUNTER — Ambulatory Visit: Payer: Self-pay | Admitting: Pulmonary Disease

## 2018-05-05 ENCOUNTER — Ambulatory Visit (INDEPENDENT_AMBULATORY_CARE_PROVIDER_SITE_OTHER): Payer: 59 | Admitting: Pulmonary Disease

## 2018-05-05 ENCOUNTER — Encounter: Payer: Self-pay | Admitting: Pulmonary Disease

## 2018-05-05 VITALS — BP 120/82 | HR 65 | Ht 66.75 in | Wt 225.0 lb

## 2018-05-05 DIAGNOSIS — G4733 Obstructive sleep apnea (adult) (pediatric): Secondary | ICD-10-CM

## 2018-05-05 NOTE — Assessment & Plan Note (Signed)
[facility] Call us when you are ready for a new machine and we can send in prescription  Weight loss encouraged, compliance with goal of at least 4-6 hrs every night is the expectation. Advised against medications with sedative side effects Cautioned against driving when sleepy - understanding that sleepiness will vary on a day to day basis

## 2018-05-05 NOTE — Patient Instructions (Signed)
Prescription for humidifier [facility]. Call us when you are ready for a new machine and we can send in prescription

## 2018-05-05 NOTE — Progress Notes (Signed)
   Subjective:    Patient ID: Val EagleLori Biela, female    DOB: 02/20/1956, 62 y.o.   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  We are unable to get a download from her current machine.  She is compliant with states that she has several nocturnal awakenings.  She would like to get a new humidifier from DME.  She complains of dryness of mouth. Overall she feels that CPAP treatment has helped improve her daytime somnolence and fatigue  Significant tests/ events reviewed  NPSG 2003:  AHI 18/hr, cpap titrated to 11cm.  Auto 06/2013:  Optimal pressure 11-12cm.   Review of Systems Patient denies significant dyspnea,cough, hemoptysis,  chest pain, palpitations, pedal edema, orthopnea, paroxysmal nocturnal dyspnea, lightheadedness, nausea, vomiting, abdominal or  leg pains      Objective:   Physical Exam  Gen. Pleasant, well-nourished, in no distress ENT - no thrush, no post nasal drip Neck: No JVD, no thyromegaly, no carotid bruits Lungs: no use of accessory muscles, no dullness to percussion, clear without rales or rhonchi  Cardiovascular: Rhythm regular, heart sounds  normal, no murmurs or gallops, no peripheral edema Musculoskeletal: No deformities, no cyanosis or clubbing        Assessment & Plan:

## 2019-05-06 ENCOUNTER — Ambulatory Visit: Payer: 59 | Admitting: Nurse Practitioner
